# Patient Record
Sex: Male | Born: 2000 | Marital: Single | Smoking: Never smoker
Health system: Southern US, Community
[De-identification: ages and names within clinical notes are randomized; demographics above are authoritative.]

## PROBLEM LIST (undated history)

## (undated) DIAGNOSIS — J45909 Unspecified asthma, uncomplicated: Secondary | ICD-10-CM

---
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
## 2020-03-20 DIAGNOSIS — Z419 Encounter for procedure for purposes other than remedying health state, unspecified: Secondary | ICD-10-CM | POA: Diagnosis not present

## 2020-04-20 DIAGNOSIS — Z419 Encounter for procedure for purposes other than remedying health state, unspecified: Secondary | ICD-10-CM | POA: Diagnosis not present

## 2020-05-21 DIAGNOSIS — Z419 Encounter for procedure for purposes other than remedying health state, unspecified: Secondary | ICD-10-CM | POA: Diagnosis not present

## 2020-06-20 DIAGNOSIS — Z419 Encounter for procedure for purposes other than remedying health state, unspecified: Secondary | ICD-10-CM | POA: Diagnosis not present

## 2020-07-21 DIAGNOSIS — Z419 Encounter for procedure for purposes other than remedying health state, unspecified: Secondary | ICD-10-CM | POA: Diagnosis not present

## 2020-08-20 DIAGNOSIS — Z419 Encounter for procedure for purposes other than remedying health state, unspecified: Secondary | ICD-10-CM | POA: Diagnosis not present

## 2020-09-20 DIAGNOSIS — Z419 Encounter for procedure for purposes other than remedying health state, unspecified: Secondary | ICD-10-CM | POA: Diagnosis not present

## 2020-10-21 DIAGNOSIS — Z419 Encounter for procedure for purposes other than remedying health state, unspecified: Secondary | ICD-10-CM | POA: Diagnosis not present

## 2020-11-18 DIAGNOSIS — Z419 Encounter for procedure for purposes other than remedying health state, unspecified: Secondary | ICD-10-CM | POA: Diagnosis not present

## 2020-12-19 DIAGNOSIS — Z419 Encounter for procedure for purposes other than remedying health state, unspecified: Secondary | ICD-10-CM | POA: Diagnosis not present

## 2021-01-18 DIAGNOSIS — Z419 Encounter for procedure for purposes other than remedying health state, unspecified: Secondary | ICD-10-CM | POA: Diagnosis not present

## 2021-02-18 DIAGNOSIS — Z419 Encounter for procedure for purposes other than remedying health state, unspecified: Secondary | ICD-10-CM | POA: Diagnosis not present

## 2021-03-20 DIAGNOSIS — Z419 Encounter for procedure for purposes other than remedying health state, unspecified: Secondary | ICD-10-CM | POA: Diagnosis not present

## 2021-03-21 DIAGNOSIS — Z202 Contact with and (suspected) exposure to infections with a predominantly sexual mode of transmission: Secondary | ICD-10-CM | POA: Diagnosis not present

## 2021-04-20 DIAGNOSIS — Z419 Encounter for procedure for purposes other than remedying health state, unspecified: Secondary | ICD-10-CM | POA: Diagnosis not present

## 2021-05-21 DIAGNOSIS — Z419 Encounter for procedure for purposes other than remedying health state, unspecified: Secondary | ICD-10-CM | POA: Diagnosis not present

## 2021-06-20 DIAGNOSIS — Z419 Encounter for procedure for purposes other than remedying health state, unspecified: Secondary | ICD-10-CM | POA: Diagnosis not present

## 2021-07-21 DIAGNOSIS — Z419 Encounter for procedure for purposes other than remedying health state, unspecified: Secondary | ICD-10-CM | POA: Diagnosis not present

## 2021-08-20 DIAGNOSIS — Z419 Encounter for procedure for purposes other than remedying health state, unspecified: Secondary | ICD-10-CM | POA: Diagnosis not present

## 2021-09-20 DIAGNOSIS — Z419 Encounter for procedure for purposes other than remedying health state, unspecified: Secondary | ICD-10-CM | POA: Diagnosis not present

## 2021-10-21 DIAGNOSIS — Z419 Encounter for procedure for purposes other than remedying health state, unspecified: Secondary | ICD-10-CM | POA: Diagnosis not present

## 2021-11-18 DIAGNOSIS — Z419 Encounter for procedure for purposes other than remedying health state, unspecified: Secondary | ICD-10-CM | POA: Diagnosis not present

## 2021-12-19 DIAGNOSIS — Z419 Encounter for procedure for purposes other than remedying health state, unspecified: Secondary | ICD-10-CM | POA: Diagnosis not present

## 2022-01-18 DIAGNOSIS — Z419 Encounter for procedure for purposes other than remedying health state, unspecified: Secondary | ICD-10-CM | POA: Diagnosis not present

## 2022-03-20 DIAGNOSIS — Z419 Encounter for procedure for purposes other than remedying health state, unspecified: Secondary | ICD-10-CM | POA: Diagnosis not present

## 2022-04-20 DIAGNOSIS — Z419 Encounter for procedure for purposes other than remedying health state, unspecified: Secondary | ICD-10-CM | POA: Diagnosis not present

## 2022-05-21 DIAGNOSIS — Z419 Encounter for procedure for purposes other than remedying health state, unspecified: Secondary | ICD-10-CM | POA: Diagnosis not present

## 2022-06-20 DIAGNOSIS — Z419 Encounter for procedure for purposes other than remedying health state, unspecified: Secondary | ICD-10-CM | POA: Diagnosis not present

## 2022-07-21 DIAGNOSIS — Z419 Encounter for procedure for purposes other than remedying health state, unspecified: Secondary | ICD-10-CM | POA: Diagnosis not present

## 2022-08-20 DIAGNOSIS — Z419 Encounter for procedure for purposes other than remedying health state, unspecified: Secondary | ICD-10-CM | POA: Diagnosis not present

## 2022-10-21 DIAGNOSIS — Z419 Encounter for procedure for purposes other than remedying health state, unspecified: Secondary | ICD-10-CM | POA: Diagnosis not present

## 2022-11-19 DIAGNOSIS — Z419 Encounter for procedure for purposes other than remedying health state, unspecified: Secondary | ICD-10-CM | POA: Diagnosis not present

## 2022-12-20 DIAGNOSIS — Z419 Encounter for procedure for purposes other than remedying health state, unspecified: Secondary | ICD-10-CM | POA: Diagnosis not present

## 2023-01-19 DIAGNOSIS — Z419 Encounter for procedure for purposes other than remedying health state, unspecified: Secondary | ICD-10-CM | POA: Diagnosis not present

## 2023-02-19 DIAGNOSIS — Z419 Encounter for procedure for purposes other than remedying health state, unspecified: Secondary | ICD-10-CM | POA: Diagnosis not present

## 2023-03-21 DIAGNOSIS — Z419 Encounter for procedure for purposes other than remedying health state, unspecified: Secondary | ICD-10-CM | POA: Diagnosis not present

## 2023-04-21 DIAGNOSIS — Z419 Encounter for procedure for purposes other than remedying health state, unspecified: Secondary | ICD-10-CM | POA: Diagnosis not present

## 2023-05-22 DIAGNOSIS — Z419 Encounter for procedure for purposes other than remedying health state, unspecified: Secondary | ICD-10-CM | POA: Diagnosis not present

## 2023-06-21 DIAGNOSIS — Z419 Encounter for procedure for purposes other than remedying health state, unspecified: Secondary | ICD-10-CM | POA: Diagnosis not present

## 2023-07-22 DIAGNOSIS — Z419 Encounter for procedure for purposes other than remedying health state, unspecified: Secondary | ICD-10-CM | POA: Diagnosis not present

## 2023-08-21 DIAGNOSIS — Z419 Encounter for procedure for purposes other than remedying health state, unspecified: Secondary | ICD-10-CM | POA: Diagnosis not present

## 2023-09-21 DIAGNOSIS — Z419 Encounter for procedure for purposes other than remedying health state, unspecified: Secondary | ICD-10-CM | POA: Diagnosis not present

## 2023-10-22 DIAGNOSIS — Z419 Encounter for procedure for purposes other than remedying health state, unspecified: Secondary | ICD-10-CM | POA: Diagnosis not present

## 2023-11-19 DIAGNOSIS — Z419 Encounter for procedure for purposes other than remedying health state, unspecified: Secondary | ICD-10-CM | POA: Diagnosis not present

## 2023-12-31 DIAGNOSIS — Z419 Encounter for procedure for purposes other than remedying health state, unspecified: Secondary | ICD-10-CM | POA: Diagnosis not present

## 2024-01-30 DIAGNOSIS — Z419 Encounter for procedure for purposes other than remedying health state, unspecified: Secondary | ICD-10-CM | POA: Diagnosis not present

## 2024-03-01 DIAGNOSIS — Z419 Encounter for procedure for purposes other than remedying health state, unspecified: Secondary | ICD-10-CM | POA: Diagnosis not present

## 2024-03-31 DIAGNOSIS — Z419 Encounter for procedure for purposes other than remedying health state, unspecified: Secondary | ICD-10-CM | POA: Diagnosis not present

## 2024-05-01 DIAGNOSIS — Z419 Encounter for procedure for purposes other than remedying health state, unspecified: Secondary | ICD-10-CM | POA: Diagnosis not present

## 2024-06-01 DIAGNOSIS — Z419 Encounter for procedure for purposes other than remedying health state, unspecified: Secondary | ICD-10-CM | POA: Diagnosis not present
